# Patient Record
Sex: Male | Born: 1935 | Race: Black or African American | Hispanic: No | Marital: Married
Health system: Southern US, Community
[De-identification: ages and names within clinical notes are randomized; demographics above are authoritative.]

---

## 2000-09-10 ENCOUNTER — Emergency Department (HOSPITAL_COMMUNITY): Admission: EM | Admit: 2000-09-10 | Discharge: 2000-09-10 | Payer: Self-pay | Admitting: Emergency Medicine

## 2000-09-14 ENCOUNTER — Inpatient Hospital Stay (HOSPITAL_COMMUNITY): Admission: EM | Admit: 2000-09-14 | Discharge: 2000-09-18 | Payer: Self-pay | Admitting: Emergency Medicine

## 2000-09-14 ENCOUNTER — Encounter: Payer: Self-pay | Admitting: Emergency Medicine

## 2004-02-29 ENCOUNTER — Emergency Department (HOSPITAL_COMMUNITY): Admission: EM | Admit: 2004-02-29 | Discharge: 2004-02-29 | Payer: Self-pay | Admitting: Emergency Medicine

## 2004-03-05 ENCOUNTER — Emergency Department (HOSPITAL_COMMUNITY): Admission: EM | Admit: 2004-03-05 | Discharge: 2004-03-06 | Payer: Self-pay | Admitting: Emergency Medicine

## 2006-01-12 ENCOUNTER — Inpatient Hospital Stay (HOSPITAL_COMMUNITY): Admission: EM | Admit: 2006-01-12 | Discharge: 2006-01-16 | Payer: Self-pay | Admitting: Emergency Medicine

## 2006-02-26 ENCOUNTER — Ambulatory Visit (HOSPITAL_COMMUNITY): Admission: RE | Admit: 2006-02-26 | Discharge: 2006-02-26 | Payer: Self-pay | Admitting: Internal Medicine

## 2008-07-12 ENCOUNTER — Inpatient Hospital Stay: Payer: Self-pay | Admitting: Internal Medicine

## 2008-08-15 DEATH — deceased

## 2010-08-09 IMAGING — CR DG CHEST 2V
1 series · 3 of 3 positions shown · non-contrast
Comparison: none

REASON FOR EXAM: esophageal tear
COMMENTS:

[Series 1: view not recorded · 0.17mm/px · 3 of 3 slices shown]
[im 1/3]
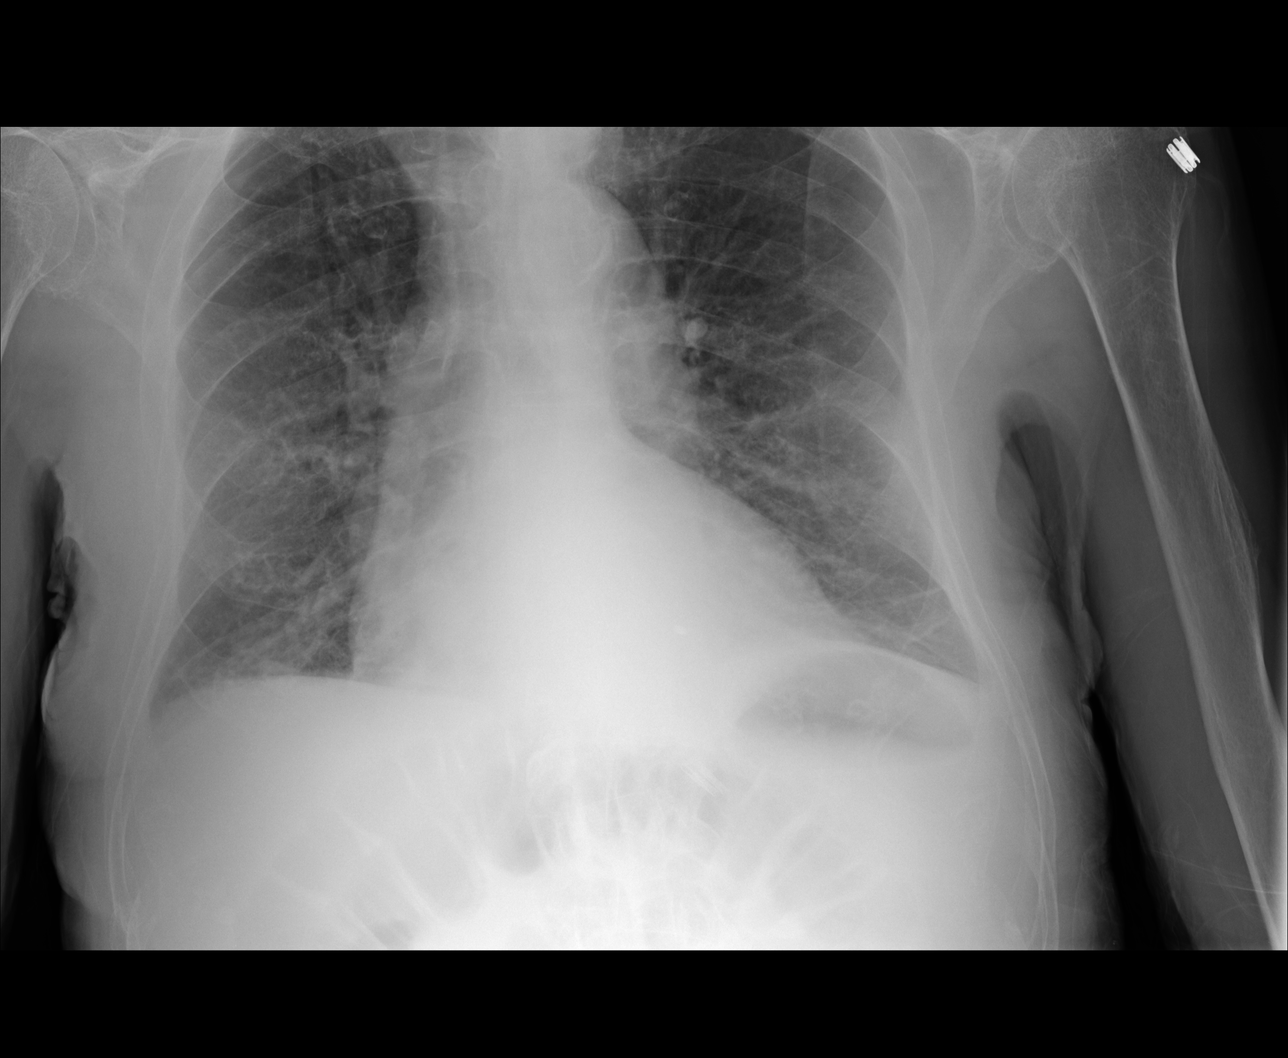
[im 2/3]
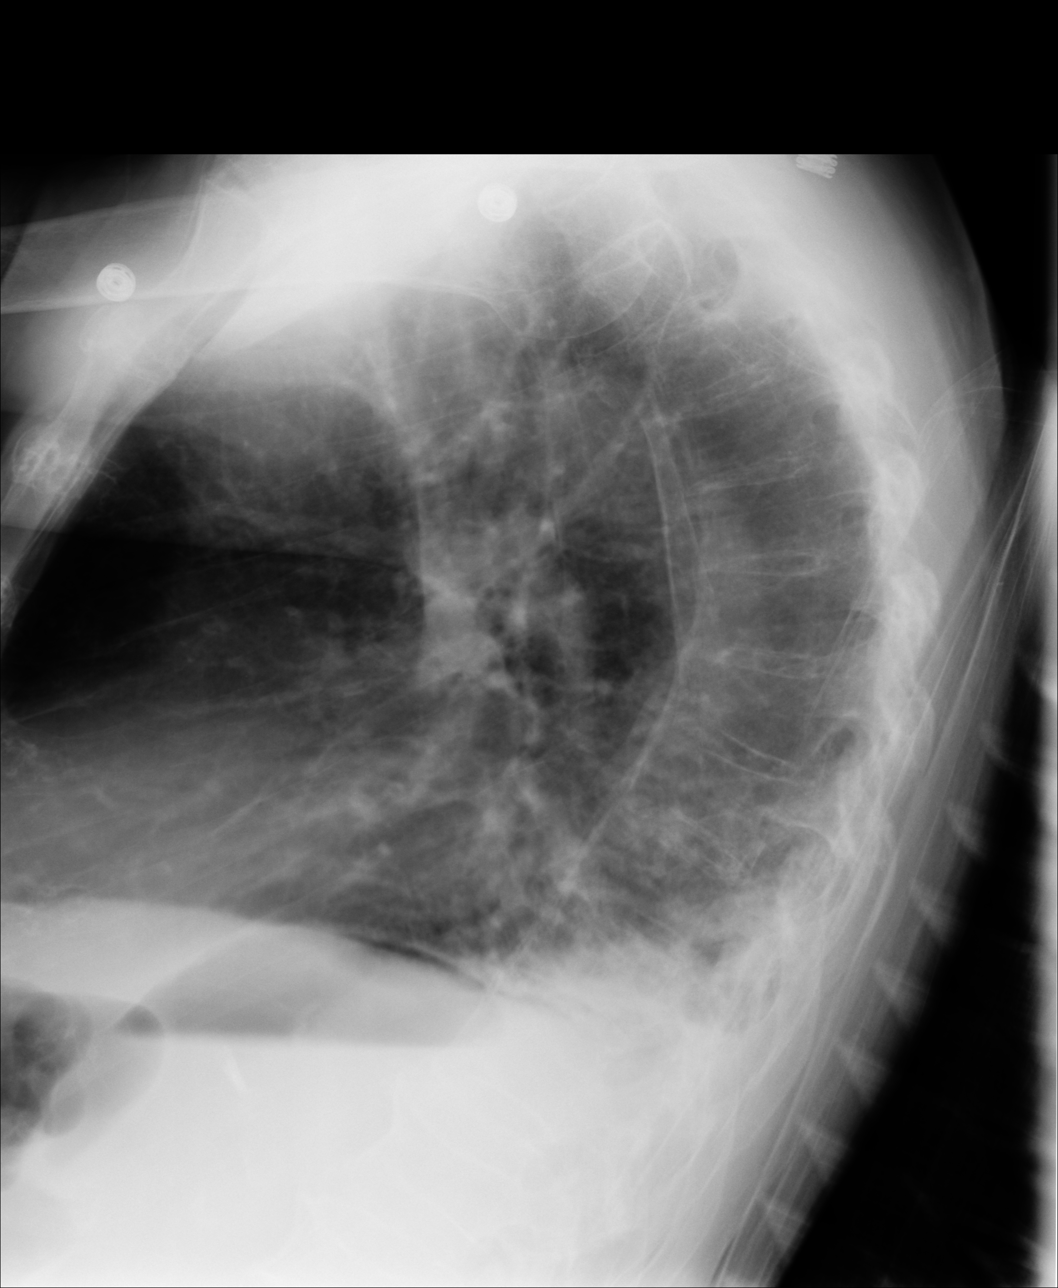
[im 3/3]
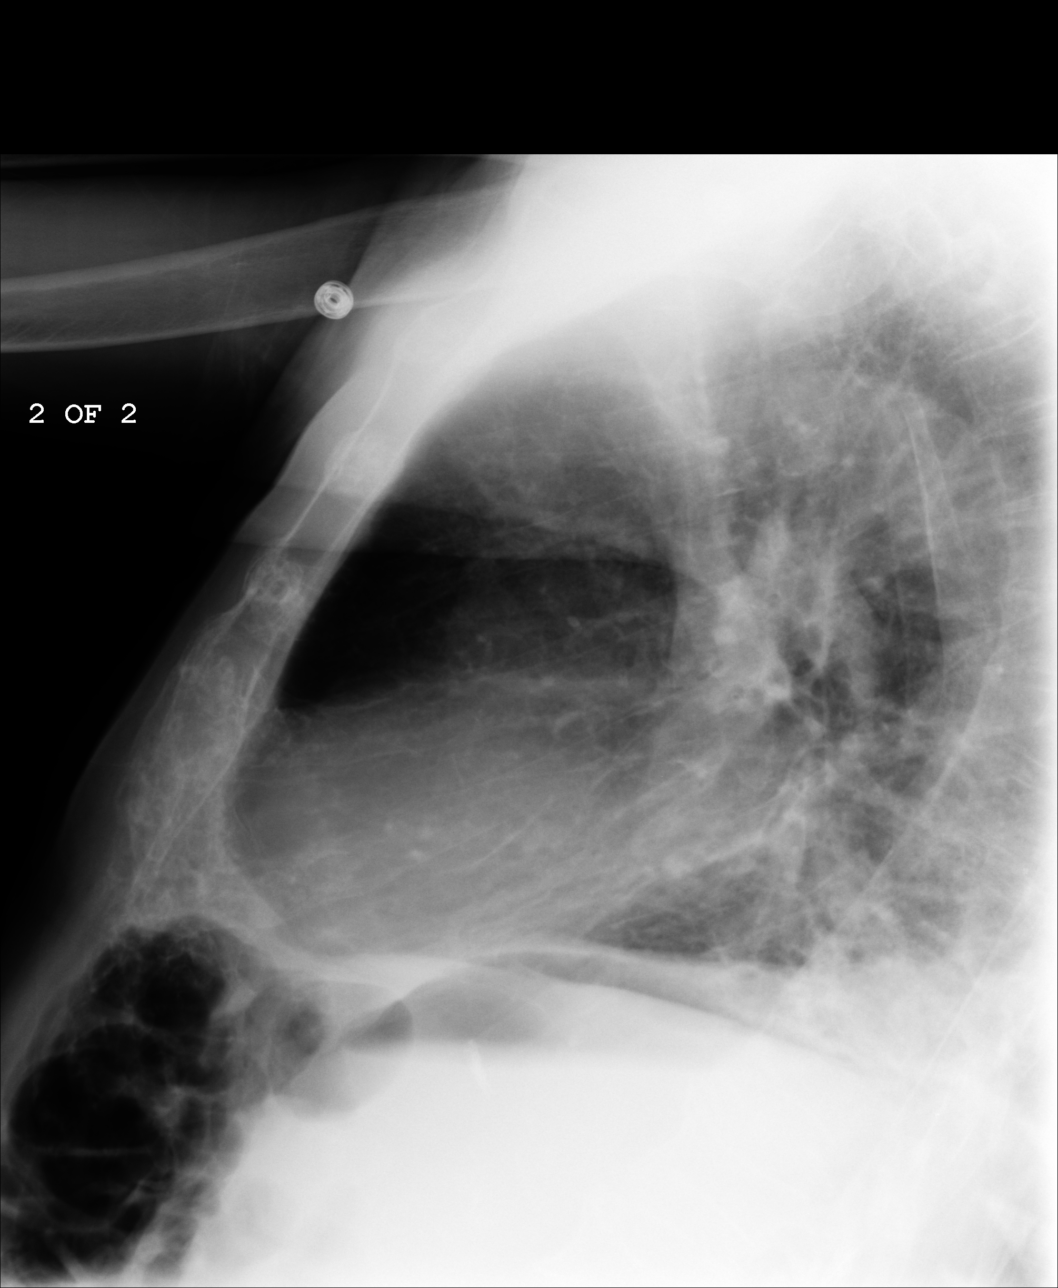

[3 of 3 positions shown; findings below may reference images not displayed]

PROCEDURE:     DXR - DXR CHEST PA (OR AP) AND LATERAL  - July 15, 2008  [DATE]

RESULT:     Comparison is made to a prior study dated 07/14/08.

There has been decreased prominence of the interstitial markings when
compared to the previous study.  An area of increased density once again
projects within the posterior aspect of the thorax. No new focal regions of
consolidation are appreciated. There is an element of mild peribronchial
cuffing. The cardiac silhouette is mildly enlarged. The visualized bony
skeleton is unremarkable.
IMPRESSION: 1.     Findings consistent with improved pulmonary edema.
2.     There does appear to be residual mild edema as well as likely an
element of pulmonary fibrosis.
3.     Atelectasis versus infiltrate within the base of the thorax. This
appears to project in possibly a LEFT lower lobe region. Continued
surveillance evaluation recommended.

## 2015-11-09 ENCOUNTER — Telehealth: Payer: Self-pay | Admitting: *Deleted

## 2016-03-20 NOTE — Telephone Encounter (Signed)
Encounter opened in error
